# Patient Record
Sex: Female | Born: 1995
Health system: Southern US, Community
[De-identification: ages and names within clinical notes are randomized; demographics above are authoritative.]

## PROBLEM LIST (undated history)

## (undated) DIAGNOSIS — J45909 Unspecified asthma, uncomplicated: Secondary | ICD-10-CM

## (undated) HISTORY — PX: TONSILLECTOMY: SUR1361

---
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
## 2018-06-20 IMAGING — DX DG HAND COMPLETE 3+V*L*
3 series · 3 of 3 positions shown · non-contrast
Comparison: None.

CLINICAL DATA: 20-year-old female who fell down stairs last night.
Ecchymosis along the volar aspect of the first metacarpal. Initial
encounter.

EXAM:
LEFT HAND - COMPLETE 3+ VIEW

[hand ap]
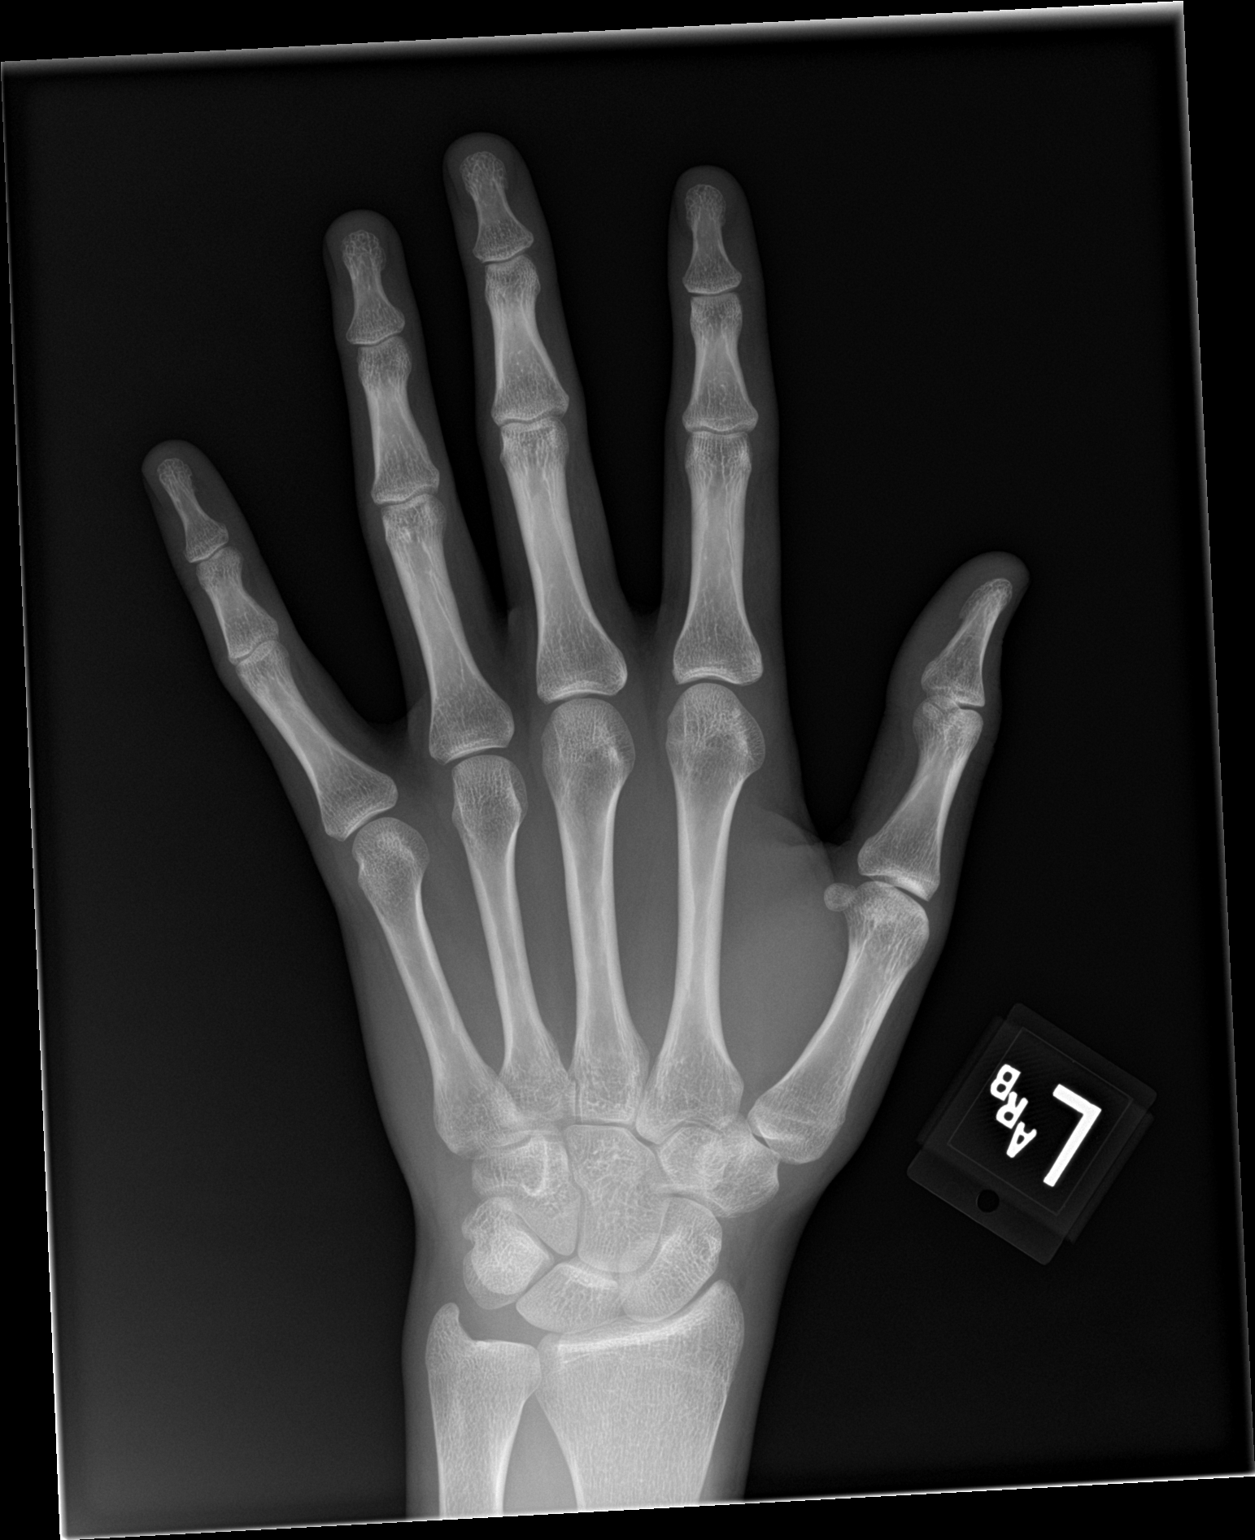

[hand obl]
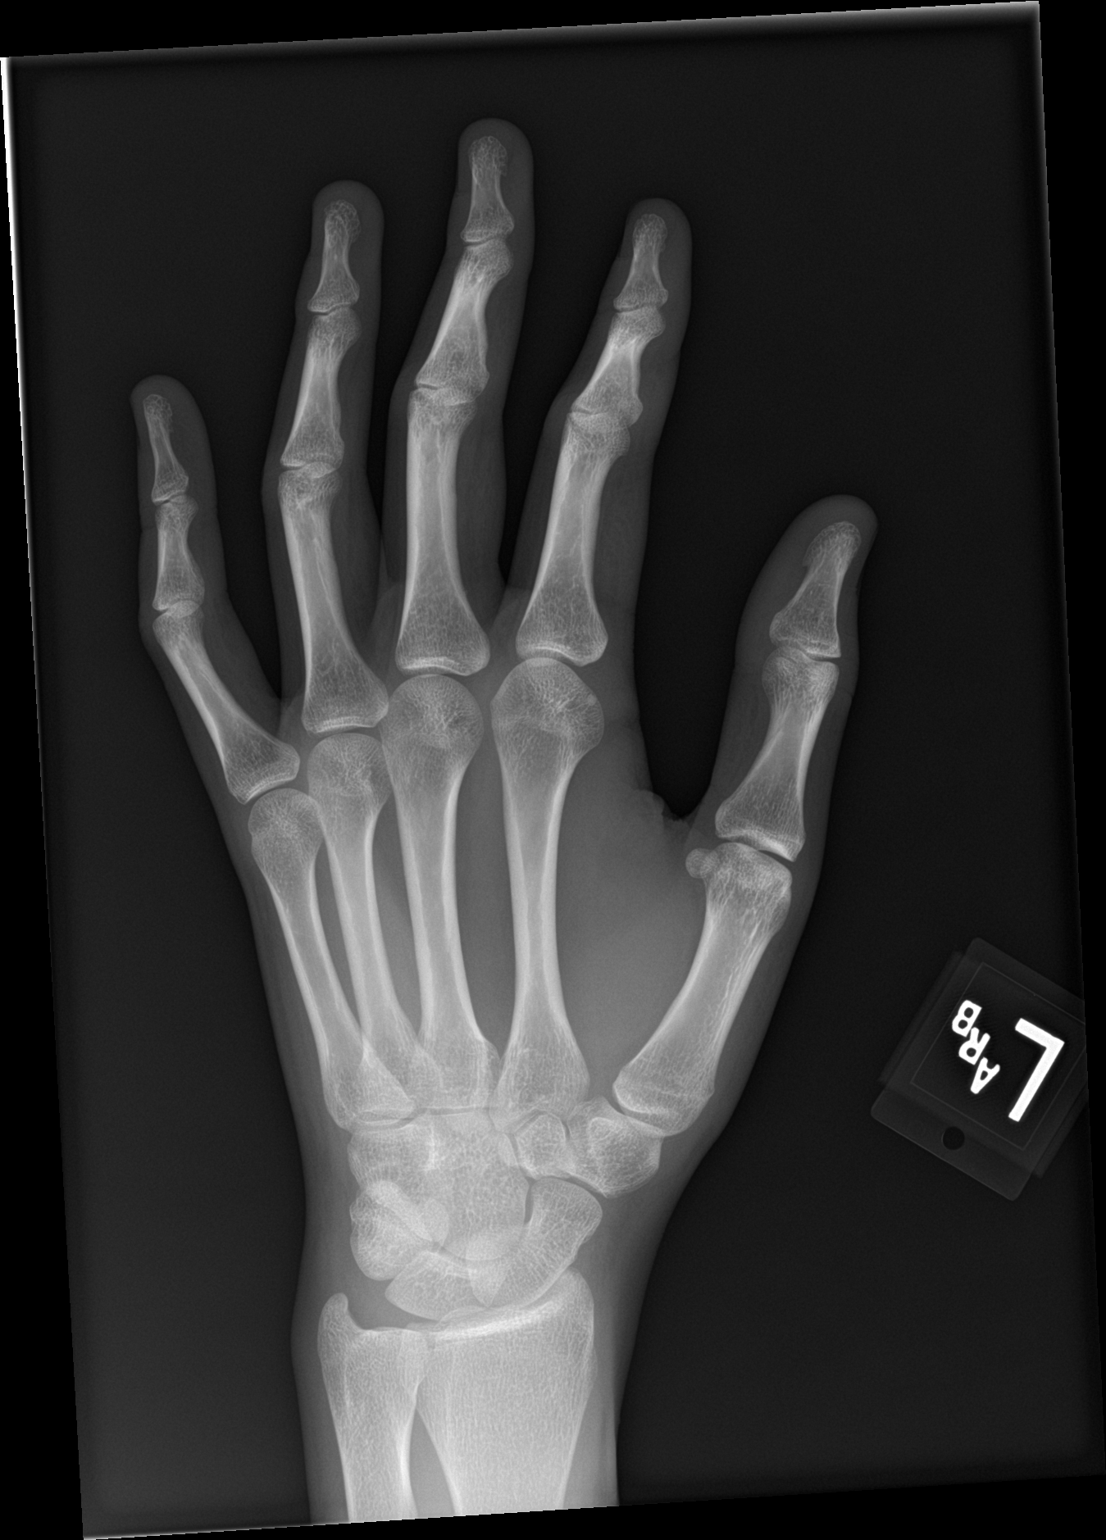

[hand lat]
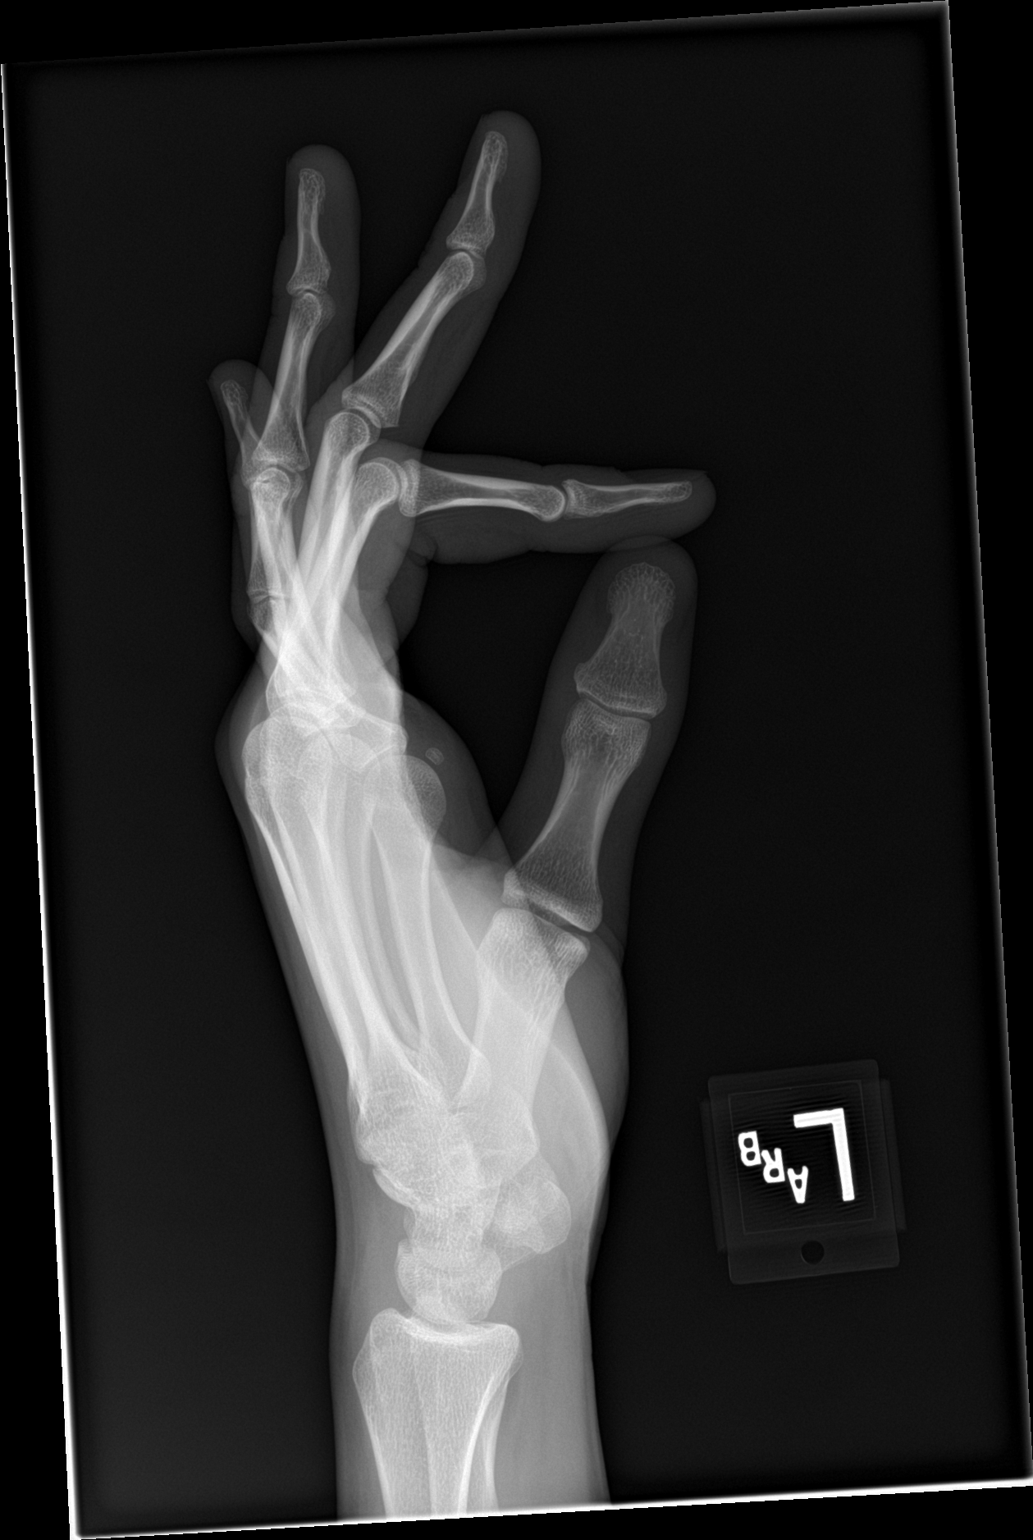

[3 of 3 positions shown; findings below may reference images not displayed]

FINDINGS: Bone mineralization is within normal limits. Distal radius and ulna
appear intact. Carpal bone alignment appears normal. The first CMC
joint and first metacarpal appear normal. The first MCP joint and
thumb phalanges appear normal. Other metacarpals and phalanges
appear intact.
IMPRESSION: No acute fracture or dislocation identified about the left hand.

## 2019-06-23 ENCOUNTER — Ambulatory Visit (HOSPITAL_COMMUNITY)
Admission: EM | Admit: 2019-06-23 | Discharge: 2019-06-23 | Disposition: A | Discharge status: Home / Self Care | Payer: BC Managed Care – PPO | Attending: Emergency Medicine | Admitting: Emergency Medicine

## 2019-06-23 ENCOUNTER — Encounter (HOSPITAL_COMMUNITY): Payer: Self-pay

## 2019-06-23 ENCOUNTER — Other Ambulatory Visit: Payer: Self-pay

## 2019-06-23 DIAGNOSIS — J029 Acute pharyngitis, unspecified: Secondary | ICD-10-CM

## 2019-06-23 HISTORY — DX: Unspecified asthma, uncomplicated: J45.909

## 2019-06-23 LAB — POCT RAPID STREP A: Streptococcus, Group A Screen (Direct): NEGATIVE

## 2019-06-23 LAB — POCT INFECTIOUS MONO SCREEN: Mono Screen: NEGATIVE

## 2019-06-23 MED ORDER — CETIRIZINE HCL 10 MG PO CAPS: 10.0000 mg | ORAL_CAPSULE | Freq: Every day | ORAL | 0 refills | Status: DC

## 2019-06-23 MED ORDER — FLUTICASONE PROPIONATE 50 MCG/ACT NA SUSP: 1.0000 | Freq: Every day | NASAL | 0 refills | Status: DC

## 2019-06-23 NOTE — ED Provider Notes (Signed)
[facility]    CSN: [id number] Arrival date & time: 06/23/19  1009      History   Chief Complaint Chief Complaint  Patient presents with  . Sore Throat    HPI Felicia Myers is a 24 y.o. female history of prior tonsillectomy, asthma presenting today for evaluation of a sore throat.  Patient reports over the past 2 to 3 days she has had sore throat and headaches.  She reports over the past week she has had lower energy than normal.  She reports slight body aches.  Denies any fevers or chills.  Has associated rhinorrhea, but no significant cough chest pain or shortness of breath.  Denies GI symptoms.  She reports that she gets strep typically once a year, concerned about this.   HPI  Past Medical History:  Diagnosis Date  . Asthma     There are no problems to display for this patient.   Past Surgical History:  Procedure Laterality Date  . TONSILLECTOMY      OB History   No obstetric history on file.      Home Medications    Prior to Admission medications   Medication Sig Start Date End Date Taking? Authorizing Provider  Cetirizine HCl 10 MG CAPS Take 1 capsule (10 mg total) by mouth daily for 10 days. 06/23/19 07/03/19  Keylie Beavers C, PA-C  fluticasone (FLONASE) 50 MCG/ACT nasal spray Place 1-2 sprays into both nostrils daily for 7 days. 06/23/19 06/30/19  Chaitanya Amedee C, PA-C  naproxen (NAPROSYN) 500 MG tablet Take 1 tablet (500 mg total) by mouth 2 (two) times daily with a meal. 11/24/15   Joni Reining, PA-C    Family History No family history on file.  Social History Social History   Tobacco Use  . Smoking status: Never Smoker  . Smokeless tobacco: Never Used  Vaping Use  . Vaping Use: Every day  Substance Use Topics  . Alcohol use: No  . Drug use: Not on file     Allergies   Patient has no known allergies.   Review of Systems Review of Systems  Constitutional: Positive for fatigue. Negative for activity change, appetite  change, chills and fever.  HENT: Positive for congestion, rhinorrhea and sore throat. Negative for ear pain, sinus pressure and trouble swallowing.   Eyes: Negative for discharge and redness.  Respiratory: Negative for cough, chest tightness and shortness of breath.   Cardiovascular: Negative for chest pain.  Gastrointestinal: Negative for abdominal pain, diarrhea, nausea and vomiting.  Musculoskeletal: Negative for myalgias.  Skin: Negative for rash.  Neurological: Negative for dizziness, light-headedness and headaches.     Physical Exam Triage Vital Signs ED Triage Vitals  Enc Vitals Group     BP 06/23/19 1044 (!) 108/59     Pulse Rate 06/23/19 1044 99     Resp 06/23/19 1044 14     Temp 06/23/19 1044 98.3 F (36.8 C)     Temp Source 06/23/19 1044 Oral     SpO2 06/23/19 1044 100 %     Weight --      Height --      Head Circumference --      Peak Flow --      Pain Score 06/23/19 1042 6     Pain Loc --      Pain Edu? --      Excl. in GC? --    No data found.  Updated Vital Signs BP (!) 108/59 (BP Location:  Left Arm)   Pulse 99   Temp 98.3 F (36.8 C) (Oral)   Resp 14   LMP 06/09/2019 (Within Days)   SpO2 100%   Visual Acuity Right Eye Distance:   Left Eye Distance:   Bilateral Distance:    Right Eye Near:   Left Eye Near:    Bilateral Near:     Physical Exam Vitals and nursing note reviewed.  Constitutional:      Appearance: She is well-developed.     Comments: No acute distress  HENT:     Head: Normocephalic and atraumatic.     Ears:     Comments: Bilateral ears without tenderness to palpation of external auricle, tragus and mastoid, EAC's without erythema or swelling, TM's with good bony landmarks and cone of light. Non erythematous.     Nose: Nose normal.     Mouth/Throat:     Comments: Oral mucosa pink and moist, tonsils present and slightly enlarged, no erythema or exudate on tonsils. Posterior pharynx patent and nonerythematous, no uvula deviation  or swelling. Normal phonation. Eyes:     Conjunctiva/sclera: Conjunctivae normal.  Cardiovascular:     Rate and Rhythm: Normal rate.  Pulmonary:     Effort: Pulmonary effort is normal. No respiratory distress.     Comments: Breathing comfortably at rest, CTABL, no wheezing, rales or other adventitious sounds auscultated Abdominal:     General: There is no distension.  Musculoskeletal:        General: Normal range of motion.     Cervical back: Neck supple.  Skin:    General: Skin is warm and dry.  Neurological:     Mental Status: She is alert and oriented to person, place, and time.      UC Treatments / Results  Labs (all labs ordered are listed, but only abnormal results are displayed) Labs Reviewed  CULTURE, GROUP A STREP [facility])  POCT INFECTIOUS MONO SCREEN  POCT INFECTIOUS MONO SCREEN  POCT RAPID STREP A    EKG   Radiology No results found.  Procedures Procedures (including critical care time)  Medications Ordered in UC Medications - No data to display  Initial Impression / Assessment and Plan / UC Course  I have reviewed the triage vital signs and the nursing notes.  Pertinent labs & imaging results that were available during my care of the patient were reviewed by me and considered in my medical decision making (see chart for details).     Strep test negative, mono negative.  Most likely viral versus postnasal drainage.  Recommending symptomatic and supportive care with close monitoring.  Strep culture pending.  Discussed strict return precautions. Patient verbalized understanding and is agreeable with plan.  Final Clinical Impressions(s) / UC Diagnoses   Final diagnoses:  Sore throat     Discharge Instructions     Sore Throat  Your rapid strep tested Negative today. Mono negative. We will send for a culture and call in about 2 days if results are positive. For now we will treat your sore throat as a virus with symptom management.   Please  continue Tylenol or Ibuprofen for fever and pain. May try salt water gargles, cepacol lozenges, throat spray, or OTC cold relief medicine for throat discomfort. If you also have congestion take a daily anti-histamine like Zyrtec, Claritin, and a oral decongestant to help with post nasal drip that may be irritating your throat.   Stay hydrated and drink plenty of fluids to keep your throat coated relieve  irritation.     ED Prescriptions    Medication Sig Dispense Auth. Provider   Cetirizine HCl 10 MG CAPS Take 1 capsule (10 mg total) by mouth daily for 10 days. 10 capsule Finnick Orosz C, PA-C   fluticasone (FLONASE) 50 MCG/ACT nasal spray Place 1-2 sprays into both nostrils daily for 7 days. 1 g Christinna Sprung, Hensley C, PA-C     PDMP not reviewed this encounter.   Lew Dawes, PA-C 06/23/19 1134

## 2019-06-23 NOTE — ED Triage Notes (Signed)
C/o sore throat x2 days and headaches. Refused covid testing

## 2019-06-23 NOTE — Discharge Instructions (Addendum)
Sore Throat  Your rapid strep tested Negative today. Mono negative. We will send for a culture and call in about 2 days if results are positive. For now we will treat your sore throat as a virus with symptom management.   Please continue Tylenol or Ibuprofen for fever and pain. May try salt water gargles, cepacol lozenges, throat spray, or OTC cold relief medicine for throat discomfort. If you also have congestion take a daily anti-histamine like Zyrtec, Claritin, and a oral decongestant to help with post nasal drip that may be irritating your throat.   Stay hydrated and drink plenty of fluids to keep your throat coated relieve irritation.

## 2019-06-25 ENCOUNTER — Encounter (HOSPITAL_COMMUNITY): Payer: Self-pay

## 2019-06-25 ENCOUNTER — Ambulatory Visit (HOSPITAL_COMMUNITY)
Admission: EM | Admit: 2019-06-25 | Discharge: 2019-06-25 | Disposition: A | Discharge status: Home / Self Care | Payer: BC Managed Care – PPO | Attending: Internal Medicine | Admitting: Internal Medicine

## 2019-06-25 ENCOUNTER — Other Ambulatory Visit: Payer: Self-pay

## 2019-06-25 DIAGNOSIS — B9789 Other viral agents as the cause of diseases classified elsewhere: Secondary | ICD-10-CM | POA: Diagnosis not present

## 2019-06-25 DIAGNOSIS — J45909 Unspecified asthma, uncomplicated: Secondary | ICD-10-CM | POA: Diagnosis not present

## 2019-06-25 DIAGNOSIS — J028 Acute pharyngitis due to other specified organisms: Secondary | ICD-10-CM | POA: Insufficient documentation

## 2019-06-25 DIAGNOSIS — R519 Headache, unspecified: Secondary | ICD-10-CM | POA: Insufficient documentation

## 2019-06-25 DIAGNOSIS — Z20822 Contact with and (suspected) exposure to covid-19: Secondary | ICD-10-CM | POA: Diagnosis not present

## 2019-06-25 DIAGNOSIS — R05 Cough: Secondary | ICD-10-CM | POA: Insufficient documentation

## 2019-06-25 DIAGNOSIS — J029 Acute pharyngitis, unspecified: Secondary | ICD-10-CM | POA: Diagnosis not present

## 2019-06-25 NOTE — ED Triage Notes (Signed)
Pt c/o sore throat, non productive cough, HAx4 days. Pt needs a COVID test in order to return to school.

## 2019-06-26 LAB — SARS CORONAVIRUS 2 (TAT 6-24 HRS): SARS Coronavirus 2: NEGATIVE

## 2019-06-26 LAB — CULTURE, GROUP A STREP (THRC)

## 2019-06-26 NOTE — ED Provider Notes (Signed)
[facility]    CSN: [id number] Arrival date & time: 06/25/19  1432      History   Chief Complaint Chief Complaint  Patient presents with  . cough, sore throat, HA    HPI Felicia Myers is a 24 y.o. female comes to the urgent care with 4-day history of sore throat, nonproductive cough and headache.  Patient symptoms started 4 days ago and is improving with the supportive care and symptomatic treatment..  Patient is here for Covid testing noted to return to school.  No fever or chills.  HPI  Past Medical History:  Diagnosis Date  . Asthma     There are no problems to display for this patient.   Past Surgical History:  Procedure Laterality Date  . TONSILLECTOMY      OB History   No obstetric history on file.      Home Medications    Prior to Admission medications   Medication Sig Start Date End Date Taking? Authorizing Provider  Cetirizine HCl 10 MG CAPS Take 1 capsule (10 mg total) by mouth daily for 10 days. 06/23/19 07/03/19  Wieters, Hallie C, PA-C  fluticasone (FLONASE) 50 MCG/ACT nasal spray Place 1-2 sprays into both nostrils daily for 7 days. 06/23/19 06/30/19  Wieters, Hallie C, PA-C  naproxen (NAPROSYN) 500 MG tablet Take 1 tablet (500 mg total) by mouth 2 (two) times daily with a meal. 11/24/15   Joni Reining, PA-C    Family History Family History  Problem Relation Age of Onset  . Diabetes Mother   . Asthma Father     Social History Social History   Tobacco Use  . Smoking status: Never Smoker  . Smokeless tobacco: Never Used  Vaping Use  . Vaping Use: Every day  . Substances: Nicotine  Substance Use Topics  . Alcohol use: No  . Drug use: Never     Allergies   Patient has no known allergies.   Review of Systems Review of Systems  HENT: Positive for sore throat. Negative for congestion, ear discharge and ear pain.   Eyes: Negative for pain, discharge and itching.  Respiratory: Negative.   Musculoskeletal: Negative for  arthralgias, back pain and gait problem.  Neurological: Negative.  Negative for dizziness, light-headedness and numbness.     Physical Exam Triage Vital Signs ED Triage Vitals  Enc Vitals Group     BP 06/25/19 1507 (!) 109/56     Pulse Rate 06/25/19 1507 79     Resp 06/25/19 1507 16     Temp 06/25/19 1507 98.8 F (37.1 C)     Temp Source 06/25/19 1507 Oral     SpO2 06/25/19 1507 100 %     Weight 06/25/19 1508 150 lb (68 kg)     Height 06/25/19 1508 6' (1.829 m)     Head Circumference --      Peak Flow --      Pain Score 06/25/19 1507 6     Pain Loc --      Pain Edu? --      Excl. in GC? --    No data found.  Updated Vital Signs BP (!) 109/56   Pulse 79   Temp 98.8 F (37.1 C) (Oral)   Resp 16   Ht 6' (1.829 m)   Wt 68 kg   LMP 06/09/2019 (Within Days)   SpO2 100%   BMI 20.34 kg/m   Visual Acuity Right Eye Distance:   Left Eye Distance:  Bilateral Distance:    Right Eye Near:   Left Eye Near:    Bilateral Near:     Physical Exam Vitals and nursing note reviewed.  Constitutional:      General: She is not in acute distress.    Appearance: Normal appearance. She is not ill-appearing.  HENT:     Right Ear: Tympanic membrane normal.     Left Ear: Tympanic membrane normal.     Nose: No congestion or rhinorrhea.     Mouth/Throat:     Mouth: Mucous membranes are moist.     Pharynx: No posterior oropharyngeal erythema.  Pulmonary:     Effort: Pulmonary effort is normal. No respiratory distress.     Breath sounds: No wheezing or rhonchi.  Abdominal:     General: Bowel sounds are normal.     Palpations: Abdomen is soft.  Neurological:     Mental Status: She is alert.      UC Treatments / Results  Labs (all labs ordered are listed, but only abnormal results are displayed) Labs Reviewed  SARS CORONAVIRUS 2 (TAT 6-24 HRS)    EKG   Radiology No results found.  Procedures Procedures (including critical care time)  Medications Ordered in  UC Medications - No data to display  Initial Impression / Assessment and Plan / UC Course  I have reviewed the triage vital signs and the nursing notes.  Pertinent labs & imaging results that were available during my care of the patient were reviewed by me and considered in my medical decision making (see chart for details).     Acute viral pharyngitis: COVID-19 PCR test sent Continue warm salt water gargle, Tylenol/Motrin for pain Patient is advised to quarantine until COVID-19 test results are available. Final Clinical Impressions(s) / UC Diagnoses   Final diagnoses:  Acute viral pharyngitis   Discharge Instructions   None    ED Prescriptions    None     PDMP not reviewed this encounter.   Chase Picket, MD 06/26/19 [phone]

## 2020-06-17 DIAGNOSIS — K5909 Other constipation: Secondary | ICD-10-CM | POA: Diagnosis not present

## 2020-06-17 DIAGNOSIS — R1030 Lower abdominal pain, unspecified: Secondary | ICD-10-CM | POA: Diagnosis not present

## 2020-06-25 DIAGNOSIS — G4700 Insomnia, unspecified: Secondary | ICD-10-CM | POA: Diagnosis not present

## 2020-06-25 DIAGNOSIS — K319 Disease of stomach and duodenum, unspecified: Secondary | ICD-10-CM | POA: Diagnosis not present

## 2020-06-25 DIAGNOSIS — Z720 Tobacco use: Secondary | ICD-10-CM | POA: Diagnosis not present

## 2020-06-25 DIAGNOSIS — Z7689 Persons encountering health services in other specified circumstances: Secondary | ICD-10-CM | POA: Diagnosis not present

## 2020-06-27 DIAGNOSIS — Z1159 Encounter for screening for other viral diseases: Secondary | ICD-10-CM | POA: Diagnosis not present

## 2020-06-27 DIAGNOSIS — Z2821 Immunization not carried out because of patient refusal: Secondary | ICD-10-CM | POA: Diagnosis not present

## 2020-06-27 DIAGNOSIS — Z0000 Encounter for general adult medical examination without abnormal findings: Secondary | ICD-10-CM | POA: Diagnosis not present

## 2020-07-28 DIAGNOSIS — Z91018 Allergy to other foods: Secondary | ICD-10-CM | POA: Diagnosis not present

## 2020-07-28 DIAGNOSIS — J309 Allergic rhinitis, unspecified: Secondary | ICD-10-CM | POA: Diagnosis not present

## 2020-10-21 DIAGNOSIS — N926 Irregular menstruation, unspecified: Secondary | ICD-10-CM | POA: Diagnosis not present

## 2020-11-06 ENCOUNTER — Ambulatory Visit (HOSPITAL_COMMUNITY)
Admission: EM | Admit: 2020-11-06 | Discharge: 2020-11-06 | Disposition: A | Discharge status: Home / Self Care | Payer: 59 | Attending: Family Medicine | Admitting: Family Medicine

## 2020-11-06 ENCOUNTER — Other Ambulatory Visit: Payer: Self-pay

## 2020-11-06 ENCOUNTER — Encounter (HOSPITAL_COMMUNITY): Payer: Self-pay | Admitting: *Deleted

## 2020-11-06 DIAGNOSIS — U071 COVID-19: Secondary | ICD-10-CM | POA: Diagnosis not present

## 2020-11-06 DIAGNOSIS — J029 Acute pharyngitis, unspecified: Secondary | ICD-10-CM | POA: Insufficient documentation

## 2020-11-06 LAB — BASIC METABOLIC PANEL
Anion gap: 9 (ref 5–15)
BUN: 6 mg/dL (ref 6–20)
CO2: 21 mmol/L — ABNORMAL LOW (ref 22–32)
Calcium: 8.7 mg/dL — ABNORMAL LOW (ref 8.9–10.3)
Chloride: 102 mmol/L (ref 98–111)
Creatinine, Ser: 0.78 mg/dL (ref 0.44–1.00)
GFR, Estimated: >60 mL/min (ref >60)
Glucose, Bld: 85 mg/dL (ref 70–99)
Potassium: 3.7 mmol/L (ref 3.5–5.1)
Sodium: 132 mmol/L — ABNORMAL LOW (ref 135–145)

## 2020-11-06 LAB — POCT RAPID STREP A, ED / UC: Streptococcus, Group A Screen (Direct): NEGATIVE

## 2020-11-06 MED ORDER — NIRMATRELVIR/RITONAVIR (PAXLOVID)TABLET: 3.0000 | ORAL_TABLET | Freq: Two times a day (BID) | ORAL | 0 refills | Status: AC

## 2020-11-06 MED ORDER — PREDNISONE 20 MG PO TABS: 40.0000 mg | ORAL_TABLET | Freq: Every day | ORAL | 0 refills | Status: DC

## 2020-11-06 MED ORDER — HYDROCODONE-ACETAMINOPHEN 5-325 MG PO TABS: 1.0000 | ORAL_TABLET | Freq: Four times a day (QID) | ORAL | 0 refills | Status: DC | PRN

## 2020-11-06 NOTE — ED Provider Notes (Signed)
[facility]   [id number] 11/06/20 Arrival Time: 1007  ASSESSMENT & PLAN:  1. COVID-19 virus infection   2. Sore throat    Discussed typical duration of viral illnesses. COVID-19 testing sent. OTC symptom care as needed.  Meds ordered this encounter  Medications   nirmatrelvir/ritonavir EUA (PAXLOVID) 20 x 150 MG & 10 x 100MG  TABS    Sig: Take 3 tablets by mouth 2 (two) times daily for 5 days. Take nirmatrelvir (150 mg) two tablets twice daily for 5 days and ritonavir (100 mg) one tablet twice daily for 5 days.    Dispense:  30 tablet    Refill:  0   HYDROcodone-acetaminophen (NORCO/VICODIN) 5-325 MG tablet    Sig: Take 1 tablet by mouth every 6 (six) hours as needed for moderate pain or severe pain.    Dispense:  10 tablet    Refill:  0   predniSONE (DELTASONE) 20 MG tablet    Sig: Take 2 tablets (40 mg total) by mouth daily.    Dispense:  10 tablet    Refill:  0   Rapid strep negative. Culture sent.  Reviewed expectations re: course of current medical issues. Questions answered. Outlined signs and symptoms indicating need for more acute intervention. Understanding verbalized. After Visit Summary given.   SUBJECTIVE: History from: patient. Felicia Myers is a 25 y.o. female who reports testing + for COVID two d ago. Reports very painful sore throat. Very limited PO intake secondary to ST. Afebrile. Mild cough. Very fatigued. Denies: difficulty breathing. Without n/v/d. OTC analgesics without much relief.  OBJECTIVE:  Vitals:   11/06/20 1103  BP: 105/73  Pulse: (!) 120  Resp: 20  Temp: 99.7 F (37.6 C)  SpO2: 97%    Tachycardia noted. General appearance: alert; no distress; appears fatigued Eyes: PERRLA; EOMI; conjunctiva normal HENT: [place]; AT; with nasal congestion; throat with significant erythema; appears very painful to swallow Neck: supple with small cerv LAD bilaterally Lungs: speaks full sentences without difficulty; unlabored Extremities: no  edema Skin: warm and dry Neurologic: normal gait Psychological: alert and cooperative; normal mood and affect  Labs: Results for orders placed or performed during the hospital encounter of 11/06/20  Basic metabolic panel  Result Value Ref Range   Sodium 132 (L) 135 - 145 mmol/L   Potassium 3.7 3.5 - 5.1 mmol/L   Chloride 102 98 - 111 mmol/L   CO2 21 (L) 22 - 32 mmol/L   Glucose, Bld 85 70 - 99 mg/dL   BUN 6 6 - 20 mg/dL   Creatinine, Ser 11/08/20 0.44 - 1.00 mg/dL   Calcium 8.7 (L) 8.9 - 10.3 mg/dL   GFR, Estimated 5.57 >32 mL/min   Anion gap 9 5 - 15  POCT Rapid Strep A  Result Value Ref Range   Streptococcus, Group A Screen (Direct) NEGATIVE NEGATIVE   Labs Reviewed  BASIC METABOLIC PANEL - Abnormal; Notable for the following components:      Result Value   Sodium 132 (*)    CO2 21 (*)    Calcium 8.7 (*)    All other components within normal limits  CULTURE, GROUP A STREP [facility])  POCT RAPID STREP A, ED / UC    Imaging: No results found.  Allergies  Allergen Reactions   Other Other (See Comments)    Pt took an allergy test and was told she had an allergy to tomatoes and hazel nuts. Pt reports she has not had a reaction.    Past  Medical History:  Diagnosis Date   Asthma    Social History   Socioeconomic History   Marital status: Single    Spouse name: Not on file   Number of children: Not on file   Years of education: Not on file   Highest education level: Not on file  Occupational History   Not on file  Tobacco Use   Smoking status: Never   Smokeless tobacco: Never  Vaping Use   Vaping Use: Every day   Substances: Nicotine  Substance and Sexual Activity   Alcohol use: No   Drug use: Never   Sexual activity: Not on file  Other Topics Concern   Not on file  Social History Narrative   Not on file   Social Determinants of Health   Financial Resource Strain: Not on file  Food Insecurity: Not on file  Transportation Needs: Not on file  Physical  Activity: Not on file  Stress: Not on file  Social Connections: Not on file  Intimate Partner Violence: Not on file   Family History  Problem Relation Age of Onset   Diabetes Mother    Asthma Father    Past Surgical History:  Procedure Laterality Date   TONSILLECTOMY       Mardella Layman, MD 11/06/20 1512

## 2020-11-06 NOTE — Discharge Instructions (Addendum)
Be aware, you have been prescribed pain medications that may cause drowsiness. While taking this medication, do not take any other medications containing acetaminophen (Tylenol). Do not combine with alcohol or other illicit drugs. Please do not drive, operate heavy machinery, or take part in activities that require making important decisions while on this medication as your judgement may be clouded.  Your rapid strep test was negative today. We have sent your throat swab for culture and will let you know of any positive results.

## 2020-11-06 NOTE — ED Triage Notes (Signed)
Pt reports testing positive for covid on Monday at [facility]. Pt works for [employer] and was directed by Health at work to come to urgent care for antiviral if she did not feel better.

## 2020-11-08 LAB — CULTURE, GROUP A STREP (THRC)

## 2020-11-26 ENCOUNTER — Other Ambulatory Visit: Payer: Self-pay

## 2020-11-26 ENCOUNTER — Ambulatory Visit (INDEPENDENT_AMBULATORY_CARE_PROVIDER_SITE_OTHER): Payer: 59 | Admitting: Family Medicine

## 2020-11-26 ENCOUNTER — Encounter: Payer: Self-pay | Admitting: Family Medicine

## 2020-11-26 ENCOUNTER — Other Ambulatory Visit (HOSPITAL_COMMUNITY)
Admission: RE | Admit: 2020-11-26 | Discharge: 2020-11-26 | Disposition: A | Discharge status: Home / Self Care | Payer: 59 | Source: Ambulatory Visit | Attending: Family Medicine | Admitting: Family Medicine

## 2020-11-26 VITALS — BP 105/73 | HR 108 | Wt 142.0 lb

## 2020-11-26 DIAGNOSIS — Z124 Encounter for screening for malignant neoplasm of cervix: Secondary | ICD-10-CM

## 2020-11-26 DIAGNOSIS — N939 Abnormal uterine and vaginal bleeding, unspecified: Secondary | ICD-10-CM | POA: Diagnosis not present

## 2020-11-26 LAB — CBC
Hematocrit: 41.7 % (ref 34.0–46.6)
Hemoglobin: 13.7 g/dL (ref 11.1–15.9)
MCH: 30.2 pg (ref 26.6–33.0)
MCHC: 32.9 g/dL (ref 31.5–35.7)
MCV: 92 fL (ref 79–97)
Platelets: 296 10*3/uL (ref 150–450)
RBC: 4.53 x10E6/uL (ref 3.77–5.28)
RDW: 12.4 % (ref 11.7–15.4)
WBC: 7.6 10*3/uL (ref 3.4–10.8)

## 2020-11-26 MED ORDER — DOXYCYCLINE HYCLATE 100 MG PO CAPS: 100.0000 mg | ORAL_CAPSULE | Freq: Two times a day (BID) | ORAL | 0 refills | Status: DC

## 2020-11-26 NOTE — Progress Notes (Signed)
   Subjective:    Patient ID: Felicia Myers is a 25 y.o. female presenting with No chief complaint on file.  on 11/26/2020  HPI: On TriSprintec due to dysmenorrhea. She has been on this for 4-5 years and stopped due to fear of infertility. Since stopping, cycles are irregular and are going from 3-6 weeks between cycles. Cycles are slightly heavy and has some spotting. No dysmenorrhea.  Review of Systems  Constitutional:  Negative for chills and fever.  Respiratory:  Negative for shortness of breath.   Cardiovascular:  Negative for chest pain.  Gastrointestinal:  Negative for abdominal pain, nausea and vomiting.  Genitourinary:  Positive for vaginal bleeding. Negative for dysuria.  Skin:  Negative for rash.     Objective:    BP 105/73   Pulse (!) 108   Wt 142 lb (64.4 kg)   LMP 11/12/2020   BMI 19.26 kg/m  Physical Exam Constitutional:      General: She is not in acute distress.    Appearance: She is well-developed.  HENT:     Head: Normocephalic and atraumatic.  Eyes:     General: No scleral icterus. Cardiovascular:     Rate and Rhythm: Normal rate.  Pulmonary:     Effort: Pulmonary effort is normal.  Abdominal:     Palpations: Abdomen is soft.  Genitourinary:    Comments: BUS normal, vagina is pink and rugated, cervix is nulliparous without lesion.  Musculoskeletal:     Cervical back: Neck supple.  Skin:    General: Skin is warm and dry.  Neurological:     Mental Status: She is alert and oriented to person, place, and time.        Assessment & Plan:   Problem List Items Addressed This Visit       Unprioritized   Abnormal uterine bleeding - Primary    Check u/s and TSH and treat with trial of doxy for presumed endometritis. Reassured her no issue with fertility and OCPs. If w/u is negative, can consider resumption of OCs for cycle control if needed.      Relevant Medications   doxycycline (VIBRAMYCIN) 100 MG capsule   Other Relevant Orders   TSH   US  PELVIC COMPLETE WITH TRANSVAGINAL   CBC   Other Visit Diagnoses     Screening for cervical cancer       Relevant Orders   Cytology - PAP( [facility])       Return in about 6 weeks (around 01/07/2021) for a follow-up, needs U/S.  Reva Bores 11/26/2020 2:50 PM

## 2020-11-26 NOTE — Assessment & Plan Note (Signed)
Check u/s and TSH and treat with trial of doxy for presumed endometritis. Reassured her no issue with fertility and OCPs. If w/u is negative, can consider resumption of OCs for cycle control if needed.

## 2020-11-27 LAB — TSH: TSH: 1.04 u[IU]/mL (ref 0.450–4.500)

## 2020-12-03 ENCOUNTER — Ambulatory Visit: Payer: 59

## 2020-12-08 LAB — CYTOLOGY - PAP
Adequacy: ABSENT
Chlamydia: NEGATIVE
Comment: NEGATIVE
Comment: NORMAL
Diagnosis: NEGATIVE
Neisseria Gonorrhea: NEGATIVE

## 2021-01-21 ENCOUNTER — Encounter: Payer: Self-pay | Admitting: Family Medicine

## 2021-01-21 ENCOUNTER — Ambulatory Visit: Payer: 59 | Admitting: Family Medicine

## 2021-01-21 NOTE — Progress Notes (Signed)
Patient did not keep appointment today. She may call to reschedule.  

## 2021-05-28 ENCOUNTER — Ambulatory Visit (INDEPENDENT_AMBULATORY_CARE_PROVIDER_SITE_OTHER): Payer: 59 | Admitting: Advanced Practice Midwife

## 2021-05-28 ENCOUNTER — Encounter: Payer: Self-pay | Admitting: Advanced Practice Midwife

## 2021-05-28 VITALS — BP 108/71 | HR 101 | Ht 72.0 in | Wt 147.0 lb

## 2021-05-28 DIAGNOSIS — Z319 Encounter for procreative management, unspecified: Secondary | ICD-10-CM | POA: Diagnosis not present

## 2021-05-28 DIAGNOSIS — N939 Abnormal uterine and vaginal bleeding, unspecified: Secondary | ICD-10-CM

## 2021-05-28 NOTE — Progress Notes (Signed)
GYNECOLOGY PROBLEM CARE ENCOUNTER NOTE  History:     Felicia Myers is a 26 y.o. G0P0000 female here for ongoing evaluation of abnormal uterine bleeding.  Patient was previously in office for this complaint on 11/26/2020. She was unable to attend her pelvic ultrasound appointment because the out of pocket cost was too high at $1500.   She reports menarche age 45 or 69. She initiated TriSprintec for dysmenorrhea. She discontinued TriSprintec in August of last year due to concern for impact on fertility. She reports about two normal cycles after stopping TriSprintec then her periods became irregular and prolonged. She sometimes has one week of spotting which is then followed be her normal period.  Denies discharge, pelvic pain, problems with intercourse or other gynecologic concerns.    Gynecologic History No LMP recorded. Contraception: none desires pregnancy Last Pap: 11/2020. Result was normal with negative HPV  Obstetric History OB History  Gravida Para Term Preterm AB Living  0 0 0 0 0 0  SAB IAB Ectopic Multiple Live Births  0 0 0 0 0    Past Medical History:  Diagnosis Date   Asthma     Past Surgical History:  Procedure Laterality Date   TONSILLECTOMY      Current Outpatient Medications on File Prior to Visit  Medication Sig Dispense Refill   doxycycline (VIBRAMYCIN) 100 MG capsule Take 1 capsule (100 mg total) by mouth 2 (two) times daily. (Patient not taking: Reported on 05/28/2021) 20 capsule 0   No current facility-administered medications on file prior to visit.    Allergies  Allergen Reactions   Other Other (See Comments)    Pt took an allergy test and was told she had an allergy to tomatoes and hazel nuts. Pt reports she has not had a reaction.    Social History:  reports that she has never smoked. She has never used smokeless tobacco. She reports current alcohol use of about 1.0 standard drink per week. She reports that she does not use drugs.  Family  History  Problem Relation Age of Onset   Asthma Father    Diabetes Mother     The following portions of the patient's history were reviewed and updated as appropriate: allergies, current medications, past family history, past medical history, past social history, past surgical history and problem list.  Review of Systems Pertinent items noted in HPI and remainder of comprehensive ROS otherwise negative.  Physical Exam:  BP 108/71   Pulse (!) 101   Ht 6' (1.829 m)   Wt 147 lb (66.7 kg)   BMI 19.94 kg/m  CONSTITUTIONAL: Well-developed, well-nourished female in no acute distress.  HENT:  Normocephalic, atraumatic EYES: Conjunctivae and EOM are normal. Pupils are equal, round, and reactive to light. No scleral icterus.  MUSCULOSKELETAL: Normal range of motion. No tenderness.  No cyanosis, clubbing, or edema. NEUROLOGIC: Alert and oriented to person, place, and time. PSYCHIATRIC: Normal mood and affect. Normal behavior. Normal judgment and thought content. CARDIOVASCULAR: Normal heart rate noted RESPIRATORY: Effort normal Assessment and Plan:    1. Abnormal uterine bleeding - Reassured that to date all surveillance has been normal -   2. Patient desires pregnancy - Use app to assess for pattern in irregular bleeding - Discussed likely timing of ovulation, use that to schedule intercourse or have sex as often as possible except when overtly on period  Clayton Bibles, MSA, MSN, CNM Certified Nurse Midwife, Biochemist, clinical for [employer], [employer]

## 2021-05-28 NOTE — Progress Notes (Signed)
Patient presents to discuss irregular, heavy and painful periods. Stopped taking Tri Sprintec Aug 2022.  Irregular periods started Dec 2022.  Desire to conceive, does not want to get back on birth control.

## 2021-08-24 DIAGNOSIS — Z0000 Encounter for general adult medical examination without abnormal findings: Secondary | ICD-10-CM | POA: Diagnosis not present

## 2021-08-24 DIAGNOSIS — Z114 Encounter for screening for human immunodeficiency virus [HIV]: Secondary | ICD-10-CM | POA: Diagnosis not present

## 2021-08-27 DIAGNOSIS — Z0000 Encounter for general adult medical examination without abnormal findings: Secondary | ICD-10-CM | POA: Diagnosis not present

## 2021-08-27 DIAGNOSIS — Z23 Encounter for immunization: Secondary | ICD-10-CM | POA: Diagnosis not present

## 2021-09-07 DIAGNOSIS — G4700 Insomnia, unspecified: Secondary | ICD-10-CM | POA: Diagnosis not present

## 2021-09-07 DIAGNOSIS — F4323 Adjustment disorder with mixed anxiety and depressed mood: Secondary | ICD-10-CM | POA: Diagnosis not present

## 2021-09-07 DIAGNOSIS — E559 Vitamin D deficiency, unspecified: Secondary | ICD-10-CM | POA: Diagnosis not present

## 2021-09-07 DIAGNOSIS — Z720 Tobacco use: Secondary | ICD-10-CM | POA: Diagnosis not present

## 2021-09-07 DIAGNOSIS — E538 Deficiency of other specified B group vitamins: Secondary | ICD-10-CM | POA: Diagnosis not present

## 2022-05-20 ENCOUNTER — Telehealth: Payer: Self-pay

## 2022-05-20 NOTE — Telephone Encounter (Signed)
Attempted to reach patient regarding message left with answering service Left voicemail for patient to call office or send mychart message with details regarding appointment needs.   

## 2023-12-05 ENCOUNTER — Emergency Department

## 2023-12-05 ENCOUNTER — Encounter: Payer: Self-pay | Admitting: Emergency Medicine

## 2023-12-05 ENCOUNTER — Other Ambulatory Visit: Payer: Self-pay

## 2023-12-05 DIAGNOSIS — R102 Pelvic and perineal pain unspecified side: Secondary | ICD-10-CM | POA: Diagnosis present

## 2023-12-05 MED ORDER — KETOROLAC TROMETHAMINE 30 MG/ML IJ SOLN
30.0000 mg | Freq: Once | INTRAMUSCULAR | Status: AC
  Administered 2023-12-05: 30 mg via INTRAMUSCULAR
  Filled 2023-12-05: qty 1

## 2023-12-05 MED ORDER — OXYCODONE-ACETAMINOPHEN 5-325 MG PO TABS
1.0000 | ORAL_TABLET | Freq: Once | ORAL | Status: AC
  Administered 2023-12-05: 1 via ORAL
  Filled 2023-12-05: qty 1

## 2023-12-06 ENCOUNTER — Encounter: Payer: Self-pay | Admitting: Advanced Practice Midwife

## 2023-12-16 NOTE — Anesthesia Preprocedure Evaluation (Addendum)
 Anesthesia Evaluation  Patient identified by MRN, date of birth, ID band Patient awake    Reviewed: Allergy & Precautions, H&P , NPO status , Patient's Chart, lab work & pertinent test results  Airway Mallampati: II  TM Distance: >3 FB Neck ROM: full    Dental no notable dental hx.    Pulmonary asthma    Pulmonary exam normal        Cardiovascular negative cardio ROS Normal cardiovascular exam     Neuro/Psych  PSYCHIATRIC DISORDERS  Depression    negative neurological ROS     GI/Hepatic negative GI ROS, Neg liver ROS,,,  Endo/Other  negative endocrine ROS    Renal/GU      Musculoskeletal   Abdominal   Peds  Hematology negative hematology ROS (+)   Anesthesia Other Findings Past Medical History: No date: Asthma  Past Surgical History: No date: TONSILLECTOMY     Reproductive/Obstetrics negative OB ROS                              Anesthesia Physical Anesthesia Plan  ASA: 2  Anesthesia Plan: General   Post-op Pain Management: Minimal or no pain anticipated   Induction: Intravenous  PONV Risk Score and Plan: 4 or greater and Dexamethasone , Ondansetron , Midazolam , Treatment may vary due to age or medical condition, TIVA and Propofol  infusion  Airway Management Planned: Natural Airway  Additional Equipment:   Intra-op Plan:   Post-operative Plan:   Informed Consent:      Dental Advisory Given  Plan Discussed with: Anesthesiologist, CRNA and Surgeon  Anesthesia Plan Comments:          Anesthesia Quick Evaluation

## 2023-12-18 MED ORDER — CELECOXIB 200 MG PO CAPS
200.0000 mg | ORAL_CAPSULE | Freq: Once | ORAL | Status: AC
  Administered 2023-12-19: 200 mg via ORAL
  Filled 2023-12-18: qty 1

## 2023-12-18 MED ORDER — LACTATED RINGERS IV SOLN: INTRAVENOUS | Status: DC

## 2023-12-18 MED ORDER — DOXYCYCLINE HYCLATE 100 MG IV SOLR
200.0000 mg | INTRAVENOUS | Status: AC
  Administered 2023-12-19: 200 mg via INTRAVENOUS
  Filled 2023-12-18: qty 200

## 2023-12-18 MED ORDER — ACETAMINOPHEN 500 MG PO TABS
1000.0000 mg | ORAL_TABLET | Freq: Once | ORAL | Status: AC
  Administered 2023-12-19: 1000 mg via ORAL
  Filled 2023-12-18: qty 2

## 2023-12-19 ENCOUNTER — Encounter: Admission: RE | Disposition: A | Payer: Self-pay | Source: Home / Self Care | Attending: Obstetrics and Gynecology

## 2023-12-19 ENCOUNTER — Ambulatory Visit
Admission: RE | Admit: 2023-12-19 | Discharge: 2023-12-19 | Disposition: A | Attending: Obstetrics and Gynecology | Admitting: Obstetrics and Gynecology

## 2023-12-19 ENCOUNTER — Ambulatory Visit: Payer: Self-pay | Admitting: Anesthesiology

## 2023-12-19 ENCOUNTER — Other Ambulatory Visit: Payer: Self-pay

## 2023-12-19 ENCOUNTER — Encounter: Payer: Self-pay | Admitting: Anesthesiology

## 2023-12-19 ENCOUNTER — Encounter: Payer: Self-pay | Admitting: Obstetrics and Gynecology

## 2023-12-19 DIAGNOSIS — Z419 Encounter for procedure for purposes other than remedying health state, unspecified: Secondary | ICD-10-CM

## 2023-12-19 DIAGNOSIS — O034 Incomplete spontaneous abortion without complication: Secondary | ICD-10-CM | POA: Diagnosis present

## 2023-12-19 HISTORY — PX: DILATION AND EVACUATION: SHX1459

## 2023-12-19 SURGERY — DILATION AND EVACUATION, UTERUS
Anesthesia: General | Site: Uterus

## 2023-12-19 MED ORDER — ONDANSETRON HCL 4 MG/2ML IJ SOLN
INTRAMUSCULAR | Status: DC | PRN
  Administered 2023-12-19: 4 mg via INTRAVENOUS

## 2023-12-19 MED ORDER — ACETAMINOPHEN 10 MG/ML IV SOLN: 1000.0000 mg | Freq: Once | INTRAVENOUS | Status: DC | PRN

## 2023-12-19 MED ORDER — DIPHENHYDRAMINE HCL 50 MG/ML IJ SOLN
INTRAMUSCULAR | Status: AC
  Filled 2023-12-19: qty 1

## 2023-12-19 MED ORDER — PROPOFOL 10 MG/ML IV BOLUS
INTRAVENOUS | Status: DC | PRN
  Administered 2023-12-19: 30 mg via INTRAVENOUS
  Administered 2023-12-19: 120 mg via INTRAVENOUS

## 2023-12-19 MED ORDER — DEXAMETHASONE SOD PHOSPHATE PF 10 MG/ML IJ SOLN
INTRAMUSCULAR | Status: DC | PRN
  Administered 2023-12-19: 10 mg via INTRAVENOUS

## 2023-12-19 MED ORDER — LIDOCAINE HCL (PF) 2 % IJ SOLN
INTRAMUSCULAR | Status: AC
  Filled 2023-12-19: qty 5

## 2023-12-19 MED ORDER — DROPERIDOL 2.5 MG/ML IJ SOLN: 0.6250 mg | Freq: Once | INTRAMUSCULAR | Status: DC | PRN

## 2023-12-19 MED ORDER — CHLORHEXIDINE GLUCONATE 0.12 % MT SOLN
OROMUCOSAL | Status: AC
  Filled 2023-12-19: qty 15

## 2023-12-19 MED ORDER — FENTANYL CITRATE (PF) 100 MCG/2ML IJ SOLN
INTRAMUSCULAR | Status: AC
  Filled 2023-12-19: qty 2

## 2023-12-19 MED ORDER — 0.9 % SODIUM CHLORIDE (POUR BTL) OPTIME
TOPICAL | Status: DC | PRN
  Administered 2023-12-19: 500 mL

## 2023-12-19 MED ORDER — PROPOFOL 10 MG/ML IV BOLUS
INTRAVENOUS | Status: AC
  Filled 2023-12-19: qty 20

## 2023-12-19 MED ORDER — MIDAZOLAM HCL 2 MG/2ML IJ SOLN
INTRAMUSCULAR | Status: AC
  Filled 2023-12-19: qty 2

## 2023-12-19 MED ORDER — MIDAZOLAM HCL (PF) 2 MG/2ML IJ SOLN
INTRAMUSCULAR | Status: DC | PRN
  Administered 2023-12-19: 2 mg via INTRAVENOUS

## 2023-12-19 MED ORDER — IBUPROFEN 600 MG PO TABS: 600.0000 mg | ORAL_TABLET | Freq: Four times a day (QID) | ORAL | 0 refills | Status: AC

## 2023-12-19 MED ORDER — PROPOFOL 1000 MG/100ML IV EMUL
INTRAVENOUS | Status: AC
  Filled 2023-12-19: qty 100

## 2023-12-19 MED ORDER — OXYCODONE HCL 5 MG PO TABS
5.0000 mg | ORAL_TABLET | Freq: Once | ORAL | Status: AC | PRN
  Administered 2023-12-19: 5 mg via ORAL

## 2023-12-19 MED ORDER — SILVER NITRATE-POT NITRATE 75-25 % EX MISC
CUTANEOUS | Status: AC
  Filled 2023-12-19: qty 10

## 2023-12-19 MED ORDER — OXYCODONE HCL 5 MG/5ML PO SOLN: 5.0000 mg | Freq: Once | ORAL | Status: AC | PRN

## 2023-12-19 MED ORDER — FENTANYL CITRATE (PF) 100 MCG/2ML IJ SOLN
INTRAMUSCULAR | Status: DC | PRN
  Administered 2023-12-19 (×2): 25 ug via INTRAVENOUS
  Administered 2023-12-19: 50 ug via INTRAVENOUS

## 2023-12-19 MED ORDER — FENTANYL CITRATE (PF) 100 MCG/2ML IJ SOLN: 25.0000 ug | INTRAMUSCULAR | Status: DC | PRN

## 2023-12-19 MED ORDER — ONDANSETRON HCL 4 MG/2ML IJ SOLN
INTRAMUSCULAR | Status: AC
  Filled 2023-12-19: qty 2

## 2023-12-19 MED ORDER — OXYCODONE HCL 5 MG PO TABS
ORAL_TABLET | ORAL | Status: AC
  Filled 2023-12-19: qty 1

## 2023-12-19 MED ORDER — LIDOCAINE HCL (CARDIAC) PF 100 MG/5ML IV SOSY
PREFILLED_SYRINGE | INTRAVENOUS | Status: DC | PRN
  Administered 2023-12-19: 60 mg via INTRAVENOUS

## 2023-12-19 MED ORDER — SILVER NITRATE-POT NITRATE 75-25 % EX MISC
CUTANEOUS | Status: DC | PRN
  Administered 2023-12-19: 2

## 2023-12-19 SURGICAL SUPPLY — 20 items
BAG URINE DRAIN 2000ML AR STRL (UROLOGICAL SUPPLIES) IMPLANT
CATH URTH 16FR FL 2W BLN LF (CATHETERS) IMPLANT
COVER LIGHT HANDLE STERIS (MISCELLANEOUS) IMPLANT
DRSG TELFA 3X8 NADH STRL (GAUZE/BANDAGES/DRESSINGS) IMPLANT
FILTER UTR ASPR SPEC (MISCELLANEOUS) ×1 IMPLANT
GLOVE BIO SURGEON STRL SZ7 (GLOVE) ×1 IMPLANT
GOWN STRL REUS W/ TWL LRG LVL3 (GOWN DISPOSABLE) ×2 IMPLANT
KIT BERKELEY 1ST TRIMESTER 3/8 (MISCELLANEOUS) ×1 IMPLANT
KIT TURNOVER CYSTO (KITS) ×1 IMPLANT
MANIFOLD NEPTUNE II (INSTRUMENTS) ×1 IMPLANT
NS IRRIG 500ML POUR BTL (IV SOLUTION) ×1 IMPLANT
PACK DNC HYST (MISCELLANEOUS) ×1 IMPLANT
PAD OB MATERNITY 11 LF (PERSONAL CARE ITEMS) ×1 IMPLANT
PAD PREP OB/GYN DISP 24X41 (PERSONAL CARE ITEMS) ×1 IMPLANT
SCRUB CHG 4% DYNA-HEX 4OZ (MISCELLANEOUS) ×1 IMPLANT
SET BERKELEY SUCTION TUBING (SUCTIONS) ×1 IMPLANT
SET CYSTO IRRIGATION (SET/KITS/TRAYS/PACK) IMPLANT
SOLN STERILE WATER 500 ML (IV SOLUTION) ×1 IMPLANT
TOWEL OR 17X26 4PK STRL BLUE (TOWEL DISPOSABLE) ×1 IMPLANT
TRAP FLUID SMOKE EVACUATOR (MISCELLANEOUS) ×1 IMPLANT

## 2023-12-19 NOTE — Transfer of Care (Signed)
 Immediate Anesthesia Transfer of Care Note  Patient: Felicia Myers  Procedure(s) Performed: DILATION AND EVACUATION, UTERUS (Uterus)  Patient Location: PACU  Anesthesia Type:General  Level of Consciousness: drowsy and patient cooperative  Airway & Oxygen Therapy: Patient Spontanous Breathing and Patient connected to face mask oxygen  Post-op Assessment: Report given to RN and Post -op Vital signs reviewed and stable  Post vital signs: stable  Last Vitals:  Vitals Value Taken Time  BP 90/59 12/19/23 08:10  Temp    Pulse 50 12/19/23 08:11  Resp 12 12/19/23 08:11  SpO2 100 % 12/19/23 08:11  Vitals shown include unfiled device data.  Last Pain:  Vitals:   12/19/23 0638  TempSrc: Temporal  PainSc: 1          Complications: No notable events documented.

## 2023-12-19 NOTE — H&P (Signed)
 Preoperative History and Physical  Felicia Myers is a 28 y.o. G1P0000 here for surgical management of incomplete miscarriage.   No significant preoperative concerns.  History of Present Illness: 28 y.o. G36P0000 female who has been diagnosed with a spontaneous abortion (miscarriage).  She had an ultrasound on 12/14/2023 that showed focal echogenic regions with increased vascularity suspicious for retained products of conception. The endometrial thickness was 13.6 mm.  The echogenic region measured 19 x 10 x 11 mm.  She has had continued vaginal bleeding and has been taking doxycycline  for infection prophylaxis.    This pregnancy has gone like this (basically): 11/12 Diagnosed with missed AB at [redacted]w[redacted]d BHCG 165K 11/19 BHCG 123K 11/22 Pt took her first dose of Cytotec 11/24 Pt took her 2nd dose of Cytotec with heavy bleeding/cramping sending the pt to the ED - u/s showing echogenic material  11/26 BHCG 16K 12/3 U/s showed heterogenous endometrium, with focal echogenic regions with increased vascularity BHCG 1K   Proposed surgery: Suction, dilation and curettage  Past Medical History:  Diagnosis Date   Asthma    Past Surgical History:  Procedure Laterality Date   TONSILLECTOMY     OB History  Gravida Para Term Preterm AB Living  1 0 0 0 0   SAB IAB Ectopic Multiple Live Births  0 0 0      # Outcome Date GA Lbr Len/2nd Weight Sex Type Anes PTL Lv  1 Current           Patient denies any other pertinent gynecologic issues.   No current facility-administered medications on file prior to encounter.   Current Outpatient Medications on File Prior to Encounter  Medication Sig Dispense Refill   acetaminophen  (TYLENOL ) 500 MG tablet Take 1,000 mg by mouth every 6 (six) hours as needed for moderate pain (pain score 4-6) or mild pain (pain score 1-3).     B Complex Vitamins (B COMPLEX PO) Take 1 tablet by mouth daily.     doxycycline  (VIBRAMYCIN ) 100 MG capsule Take 100 mg by mouth 2 (two) times  daily.     Prenatal Vit-Fe Fumarate-FA (PRENATAL PO) Take 1 tablet by mouth daily.     Allergies  Allergen Reactions   Other Other (See Comments)    Pt took an allergy test and was told she had an allergy to tomatoes and hazel nuts. Pt reports she has not had a reaction.    Social History:   reports that she has never smoked. She has never used smokeless tobacco. She reports that she does not currently use alcohol. She reports that she does not use drugs.  Family History  Problem Relation Age of Onset   Asthma Father    Diabetes Mother     Review of Systems: Noncontributory  PHYSICAL EXAM: Blood pressure 105/75, pulse 71, temperature (!) 97 F (36.1 C), temperature source Temporal, resp. rate 16, height 6' (1.829 m), weight 72.6 kg, last menstrual period 09/17/2023, SpO2 100%. CONSTITUTIONAL: Well-developed, well-nourished female in no acute distress.  HENT:  Normocephalic, atraumatic, External right and left ear normal. Oropharynx is clear and moist EYES: Conjunctivae and EOM are normal. Pupils are equal, round, and reactive to light. No scleral icterus.  NECK: Normal range of motion, supple, no masses SKIN: Skin is warm and dry. No rash noted. Not diaphoretic. No erythema. No pallor. NEUROLGIC: Alert and oriented to person, place, and time. Normal reflexes, muscle tone coordination. No cranial nerve deficit noted. PSYCHIATRIC: Normal mood and affect. Normal behavior. Normal  judgment and thought content. CARDIOVASCULAR: Normal heart rate noted, regular rhythm RESPIRATORY: Effort and breath sounds normal, no problems with respiration noted ABDOMEN: Soft, nontender, nondistended. PELVIC: Deferred MUSCULOSKELETAL: Normal range of motion. No edema and no tenderness. 2+ distal pulses.  Labs: Results for orders placed or performed during the hospital encounter of 12/05/23 (from the past 2 weeks)  CBC with Differential/Platelet   Collection Time: 12/05/23  3:01 PM  Result Value Ref  Range   WBC 12.6 (H) 4.0 - 10.5 K/uL   RBC 4.20 3.87 - 5.11 MIL/uL   Hemoglobin 12.3 12.0 - 15.0 g/dL   HCT 62.8 63.9 - 53.9 %   MCV 88.3 80.0 - 100.0 fL   MCH 29.3 26.0 - 34.0 pg   MCHC 33.2 30.0 - 36.0 g/dL   RDW 83.9 (H) 88.4 - 84.4 %   Platelets 298 150 - 400 K/uL   nRBC 0.0 0.0 - 0.2 %   Neutrophils Relative % 75 %   Neutro Abs 9.3 (H) 1.7 - 7.7 K/uL   Lymphocytes Relative 18 %   Lymphs Abs 2.3 0.7 - 4.0 K/uL   Monocytes Relative 5 %   Monocytes Absolute 0.7 0.1 - 1.0 K/uL   Eosinophils Relative 2 %   Eosinophils Absolute 0.2 0.0 - 0.5 K/uL   Basophils Relative 0 %   Basophils Absolute 0.1 0.0 - 0.1 K/uL   Immature Granulocytes 0 %   Abs Immature Granulocytes 0.05 0.00 - 0.07 K/uL  hCG, quantitative, pregnancy   Collection Time: 12/05/23  3:01 PM  Result Value Ref Range   hCG, Beta Chain, Quant, S 50,109 (H) <5 mIU/mL  ABO/Rh   Collection Time: 12/05/23  3:01 PM  Result Value Ref Range   ABO/RH(D)      A POS Performed at [facility], [address] [place], [place] [address]     Imaging Studies: US  OB LESS THAN 14 WEEKS WITH OB TRANSVAGINAL Result Date: 12/05/2023 CLINICAL DATA:  [id number] Pelvic pain 390131 EXAM: OBSTETRIC <14 WK US  AND TRANSVAGINAL OB US  TECHNIQUE: Both transabdominal and transvaginal ultrasound examinations were performed for complete evaluation of the gestation as well as the maternal uterus, adnexal regions, and pelvic cul-de-sac. Transvaginal technique was performed to assess early pregnancy. COMPARISON:  None Available. FINDINGS: Intrauterine gestational sac: Not present, at this time. Yolk sac:  Not present, at this time. Fetal Pole:  Not present, at this time. Subchorionic hemorrhage:  None visualized. Maternal uterus/adnexae: The endometrium is thickened measuring 2.6 cm. Ovoid cystic structure containing echogenic material in the endocervical canal measures 4.5 x 2.3 x 1.4 cm. Nonenlarged ovaries. No free pelvic fluid. IMPRESSION: No  intrauterine gestational sac. Ovoid, cystic structure containing echogenic material within the endocervical canal, measuring 4.5 x 2.3 x 1.4 cm. This could represent either blood products or a passing gestational sac. Obstetric consultation with serial beta hCG and sonographic follow-up should be obtained, as clinically warranted. Electronically Signed   By: Rogelia Myers M.D.   On: 12/05/2023 19:11    Assessment: Incomplete spontaneous abortion (miscarriage)  Plan: Patient will undergo surgical management with the above noted surgery.   The risks of surgery were discussed in detail with the patient including but not limited to: bleeding which may require transfusion or reoperation; infection which may require antibiotics; injury to surrounding organs which may involve bowel, bladder, ureters ; need for additional procedures including laparoscopy or laparotomy; thromboembolic phenomenon, surgical site problems and other postoperative/anesthesia complications. Likelihood of success in alleviating the patient's  condition was discussed. Routine postoperative instructions will be reviewed with the patient and her family in detail after surgery.  The patient concurred with the proposed plan, giving informed written consent for the surgery.  Patient has been NPO since last night she will remain NPO for procedure.  Anesthesia and OR aware.  Preoperative prophylactic antibiotics, as indicated, and SCDs ordered on call to the OR.  To OR when ready.  Garnette Mace, MD 12/19/2023 7:14 AM

## 2023-12-19 NOTE — Anesthesia Postprocedure Evaluation (Signed)
 Anesthesia Post Note  Patient: Felicia Myers  Procedure(s) Performed: DILATION AND EVACUATION, UTERUS (Uterus)  Patient location during evaluation: PACU Anesthesia Type: General Level of consciousness: awake and alert Pain management: pain level controlled Vital Signs Assessment: post-procedure vital signs reviewed and stable Respiratory status: spontaneous breathing, nonlabored ventilation and respiratory function stable Cardiovascular status: blood pressure returned to baseline and stable Postop Assessment: no apparent nausea or vomiting Anesthetic complications: no   No notable events documented.   Last Vitals:  Vitals:   12/19/23 0830 12/19/23 0850  BP: (!) 98/55 117/85  Pulse: 76 60  Resp: 17 18  Temp: 36.9 C 36.7 C  SpO2: 100% 100%    Last Pain:  Vitals:   12/19/23 0850  TempSrc: Temporal  PainSc: 0-No pain                 Camellia Merilee Louder

## 2023-12-19 NOTE — Op Note (Signed)
  Operative Note    Name: Felicia Myers  Date of Service: 12/19/2023  DOB: Jul 18, 1995  MRN: [id number]   Pre-Operative Diagnosis: Incomplete spontaneous abortion  Post-Operative Diagnosis: Incomplete spontaneous abortion  Procedures: Suction, dilation and curettage  Primary Surgeon: Garnette Mace, MD   EBL: 75 mL   IVF: 500 mL   Urine output: 125 mL  Specimens: products of conception  Drains: none  Complications: None   Disposition: PACU   Condition: Stable   Findings: retroverted, slightly enlarged uterus with a small amount of products of conception. Otherwise, normal external female reproductive anatomy  Procedure Summary:  After informed consent was confirmed, the patient was taken to the operating room where general anesthesia was induced.  Her legs were carefully placed in [place] stirrups.  She was prepped and draped in the standard fashion.  A speculum was placed in the vagina.   A tenaculum was placed on the anterior lip of the cervix.  The cervix was serially dilated to 8 mm.  The 8 mm flexible suction curette was advanced to the uterine fundus.  Two passes were made with the suction curette with evacuation of adequate tissue noted.  Two passes were made using sharp curettage until a gritty texture was noted.  One final pass was made with the rigid suction curette.  All instruments were removed from the vagina and the cervix was noted to be hemostatic after removal of the tenaculum and application of silver  nitrate.  There was no active bleeding from the external cervical os.  The speculum was removed and a digital sweep confirmed that there was no instruments or sponges remained in the vagina.   The patient tolerated the procedure well.  Sponge, lap, needle, and instrument counts were correct x 2.  VTE prophylaxis: SCDs. Antibiotic prophylaxis: Doxycycline  200 mg IV. She was awakened in the operating room and was taken to the PACU in stable condition.   Garnette Mace,  MD 12/19/2023 8:05 AM

## 2023-12-20 ENCOUNTER — Encounter: Payer: Self-pay | Admitting: Obstetrics and Gynecology

## 2023-12-20 LAB — SURGICAL PATHOLOGY
